# Patient Record
Sex: Female | Born: 1955 | Race: White | Hispanic: No | Marital: Married | Smoking: Never smoker
Health system: Southern US, Community
[De-identification: ages and names within clinical notes are randomized; demographics above are authoritative.]

## PROBLEM LIST (undated history)

## (undated) DIAGNOSIS — C801 Malignant (primary) neoplasm, unspecified: Secondary | ICD-10-CM

## (undated) DIAGNOSIS — R011 Cardiac murmur, unspecified: Secondary | ICD-10-CM

## (undated) DIAGNOSIS — I10 Essential (primary) hypertension: Secondary | ICD-10-CM

## (undated) HISTORY — DX: Essential (primary) hypertension: I10

## (undated) HISTORY — PX: NO PAST SURGERIES: SHX2092

## (undated) HISTORY — PX: COLONOSCOPY: SHX174

## (undated) HISTORY — DX: Malignant (primary) neoplasm, unspecified: C80.1

## (undated) HISTORY — PX: APPENDECTOMY: SHX54

## (undated) HISTORY — DX: Cardiac murmur, unspecified: R01.1

---

## 1997-08-16 ENCOUNTER — Other Ambulatory Visit: Admission: RE | Admit: 1997-08-16 | Discharge: 1997-08-16 | Payer: Self-pay | Admitting: Obstetrics and Gynecology

## 1999-05-24 ENCOUNTER — Other Ambulatory Visit: Admission: RE | Admit: 1999-05-24 | Discharge: 1999-05-24 | Payer: Self-pay | Admitting: Obstetrics and Gynecology

## 2000-05-22 ENCOUNTER — Other Ambulatory Visit: Admission: RE | Admit: 2000-05-22 | Discharge: 2000-05-22 | Payer: Self-pay | Admitting: Obstetrics and Gynecology

## 2000-09-12 ENCOUNTER — Other Ambulatory Visit: Admission: RE | Admit: 2000-09-12 | Discharge: 2000-09-12 | Payer: Self-pay | Admitting: Obstetrics and Gynecology

## 2001-06-04 ENCOUNTER — Encounter: Admission: RE | Admit: 2001-06-04 | Discharge: 2001-06-04 | Payer: Self-pay

## 2001-06-23 ENCOUNTER — Encounter: Admission: RE | Admit: 2001-06-23 | Discharge: 2001-06-23 | Payer: Self-pay

## 2003-03-18 ENCOUNTER — Encounter: Admission: RE | Admit: 2003-03-18 | Discharge: 2003-03-18 | Payer: Self-pay | Admitting: General Surgery

## 2003-10-07 ENCOUNTER — Other Ambulatory Visit: Admission: RE | Admit: 2003-10-07 | Discharge: 2003-10-07 | Payer: Self-pay | Admitting: Gynecology

## 2004-10-25 ENCOUNTER — Other Ambulatory Visit: Admission: RE | Admit: 2004-10-25 | Discharge: 2004-10-25 | Payer: Self-pay | Admitting: Gynecology

## 2005-12-04 ENCOUNTER — Other Ambulatory Visit: Admission: RE | Admit: 2005-12-04 | Discharge: 2005-12-04 | Payer: Self-pay | Admitting: Gynecology

## 2006-12-12 ENCOUNTER — Other Ambulatory Visit: Admission: RE | Admit: 2006-12-12 | Discharge: 2006-12-12 | Payer: Self-pay | Admitting: Gynecology

## 2008-01-15 ENCOUNTER — Other Ambulatory Visit: Admission: RE | Admit: 2008-01-15 | Discharge: 2008-01-15 | Payer: Self-pay | Admitting: Gynecology

## 2013-06-30 ENCOUNTER — Encounter: Payer: Self-pay | Admitting: Pulmonary Disease

## 2013-07-10 ENCOUNTER — Institutional Professional Consult (permissible substitution): Payer: Self-pay | Admitting: Pulmonary Disease

## 2013-08-13 ENCOUNTER — Ambulatory Visit (INDEPENDENT_AMBULATORY_CARE_PROVIDER_SITE_OTHER): Payer: BC Managed Care – PPO | Admitting: Pulmonary Disease

## 2013-08-13 ENCOUNTER — Encounter: Payer: Self-pay | Admitting: Pulmonary Disease

## 2013-08-13 ENCOUNTER — Encounter (INDEPENDENT_AMBULATORY_CARE_PROVIDER_SITE_OTHER): Payer: Self-pay

## 2013-08-13 VITALS — BP 112/76 | HR 50 | Temp 97.8°F | Ht 62.0 in | Wt 145.0 lb

## 2013-08-13 DIAGNOSIS — R0683 Snoring: Secondary | ICD-10-CM

## 2013-08-13 DIAGNOSIS — R0609 Other forms of dyspnea: Secondary | ICD-10-CM

## 2013-08-13 DIAGNOSIS — R0989 Other specified symptoms and signs involving the circulatory and respiratory systems: Secondary | ICD-10-CM

## 2013-08-13 NOTE — Progress Notes (Signed)
Subjective:    Patient ID: Alison Robles, female    DOB: 02-05-1956, 58 y.o.   MRN: [id number]  HPI The patient is a 58 year old female who comes in today as a self-referral for possible sleep apnea. She is have snoring issues for years, but the last 6 months has been noted to have worsening snoring by her husband. He is also commented on an abnormal breathing sounds during sleep. The patient awakens 2-3 times a night on a consistent basis, and is not rested in the mornings upon arising. She feels definite sleep pressure during the day with inactivity, and will use a five-hour energy during in order to stay awake. She gets sleepy in the evenings watching television or movies, and occasional sleepiness with driving. The patient's weight is up about 15 pounds over the last 2 years, and her Epworth score today is 13.   The patient is a 58 year old female who I Sleep Questionnaire What time do you typically go to bed?( Between what hours) 10 pm 10 pm at 1151 on 08/13/13 by Lilli Few, CMA How long does it take you to fall asleep? 10 minutes 10 minutes at 1151 on 08/13/13 by Lilli Few, CMA How many times during the night do you wake up? 2 2 at 1151 on 08/13/13 by Lilli Few, CMA What time do you get out of bed to start your day? [phone] at 1151 on 08/13/13 by Lilli Few, CMA Do you drive or operate heavy machinery in your occupation? No No at 1151 on 08/13/13 by Lilli Few, CMA How much has your weight changed (up or down) over the past two years? (In pounds) 12 lb (5.443 kg) 12 lb (5.443 kg) at 1151 on 08/13/13 by Lilli Few, CMA Have you ever had a sleep study before? No No at 1151 on 08/13/13 by Lilli Few, CMA Do you currently use CPAP? No No at 1151 on 08/13/13 by Lilli Few, CMA Do you wear oxygen at any time? No No at 1151 on 08/13/13 by Lilli Few, CMA   Review of Systems  Constitutional:  Negative for fever and unexpected weight change.  HENT: Negative for congestion, dental problem, ear pain, nosebleeds, postnasal drip, rhinorrhea, sinus pressure, sneezing, sore throat and trouble swallowing.   Eyes: Negative for redness and itching.  Respiratory: Negative for cough, chest tightness, shortness of breath and wheezing.   Cardiovascular: Negative for palpitations and leg swelling.  Gastrointestinal: Negative for nausea and vomiting.  Genitourinary: Negative for dysuria.  Musculoskeletal: Negative for joint swelling.  Skin: Negative for rash.  Neurological: Negative for headaches.  Hematological: Does not bruise/bleed easily.  Psychiatric/Behavioral: Negative for dysphoric mood. The patient is not nervous/anxious.        Objective:   Physical Exam Constitutional:  Overweight female, no acute distress  HENT:  Nares patent without discharge  Oropharynx without exudate, palate and uvula are moderately elongated.  Eyes:  Perrla, eomi, no scleral icterus  Neck:  No JVD, no TMG  Cardiovascular:  Normal rate, regular rhythm, no rubs or gallops.  No murmurs        Intact distal pulses  Pulmonary :  Normal breath sounds, no stridor or respiratory distress   No rales, rhonchi, or wheezing  Abdominal:  Soft, nondistended, bowel sounds present.  No tenderness noted.   Musculoskeletal:  No lower extremity edema noted.  Lymph Nodes:  No cervical lymphadenopathy noted  Skin:  No cyanosis noted  Neurologic:  Alert, appropriate, moves all 4 extremities without obvious deficit.       Assessment & Plan:

## 2013-08-13 NOTE — Patient Instructions (Signed)
I suspect you have symptomatic snoring, but cannot exclude mild sleep apnea.   Work on weight loss, and try to avoid sleeping on your back if possible. If you feel you are not making progress, or if your sleepiness is impacting your quality of life and safety, please call us to arrange a sleep study.

## 2013-08-13 NOTE — Assessment & Plan Note (Signed)
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 I cannot exclude the possibility of mild sleep apnea, but certainly her history is not consistent with more moderate or severe disease. I have outlined a conservative approach with a trial of weight loss and positional therapy, versus proceeding with a sleep study to see if she does have sleep apnea. I do not see this being a cardiac risk factor for her, and therefore her decision should be based on its impact to her quality of life. At this point, the patient would like to take some time to work on weight loss didn't see how she does. If she is not able to lose weight, and continues to have daytime symptoms, she will call to arrange for a sleep study.

## 2019-02-27 ENCOUNTER — Other Ambulatory Visit: Payer: Self-pay

## 2019-02-27 DIAGNOSIS — Z20822 Contact with and (suspected) exposure to covid-19: Secondary | ICD-10-CM

## 2019-02-27 NOTE — Addendum Note (Signed)
Addended by: Alan Mulder on: 02/27/2019 10:27 AM   Modules accepted: Orders

## 2019-03-01 LAB — NOVEL CORONAVIRUS, NAA: SARS-CoV-2, NAA: DETECTED — AB

## 2020-07-12 ENCOUNTER — Encounter: Payer: Self-pay | Admitting: Gastroenterology

## 2020-09-07 ENCOUNTER — Ambulatory Visit (AMBULATORY_SURGERY_CENTER): Payer: BC Managed Care – PPO | Admitting: *Deleted

## 2020-09-07 ENCOUNTER — Other Ambulatory Visit: Payer: Self-pay

## 2020-09-07 VITALS — Ht 61.0 in | Wt 134.0 lb

## 2020-09-07 DIAGNOSIS — Z1211 Encounter for screening for malignant neoplasm of colon: Secondary | ICD-10-CM

## 2020-09-07 MED ORDER — CLENPIQ 10-3.5-12 MG-GM -GM/160ML PO SOLN: 1.0000 | Freq: Once | ORAL | 0 refills | Status: AC

## 2020-09-07 NOTE — Progress Notes (Signed)
Pt's previsit is done over the phone and all paperwork (prep instructions, blank consent form to just read over) sent to patient.  Pt's name and DOB verified at the beginning of the previsit.  Pt denies any difficulty with ambulating.    No trouble with anesthesia, denies trouble moving neck, or hx/fam hx of malignant hyperthermia per pt    No egg or soy allergy  No home oxygen use   No medications for weight loss taken  emmi information given  Pt denies constipation issues  Pt informed that we do not do prior authorizations for prep   Clenpiq coupon sent

## 2020-09-19 ENCOUNTER — Encounter: Payer: Self-pay | Admitting: Gastroenterology

## 2020-10-14 ENCOUNTER — Telehealth: Payer: Self-pay | Admitting: Gastroenterology

## 2020-10-14 DIAGNOSIS — Z1211 Encounter for screening for malignant neoplasm of colon: Secondary | ICD-10-CM

## 2020-10-14 MED ORDER — CLENPIQ 10-3.5-12 MG-GM -GM/160ML PO SOLN: 1.0000 | ORAL | 0 refills | Status: DC

## 2020-10-14 NOTE — Telephone Encounter (Signed)
Inbound call from patient. Have procedure 7/12 but pharmacy states does not have her prep Clenpiq.

## 2020-10-14 NOTE — Telephone Encounter (Signed)
Sent Clenpiq to [facility] in [place]- was sent to [place] and they are closed - pt aware

## 2020-10-18 ENCOUNTER — Encounter: Payer: Self-pay | Admitting: Gastroenterology

## 2020-10-18 ENCOUNTER — Ambulatory Visit (AMBULATORY_SURGERY_CENTER): Payer: BC Managed Care – PPO | Admitting: Gastroenterology

## 2020-10-18 ENCOUNTER — Other Ambulatory Visit: Payer: Self-pay

## 2020-10-18 VITALS — BP 102/60 | HR 51 | Temp 97.4°F | Resp 15 | Ht 61.0 in | Wt 134.0 lb

## 2020-10-18 DIAGNOSIS — Z1211 Encounter for screening for malignant neoplasm of colon: Secondary | ICD-10-CM

## 2020-10-18 MED ORDER — SODIUM CHLORIDE 0.9 % IV SOLN: 500.0000 mL | INTRAVENOUS | Status: DC

## 2020-10-18 NOTE — Patient Instructions (Signed)
Please read handouts provided. Continue present medications. Return to GI clinic as needed. Repeat colonoscopy in 10 years, sooner if clinically indicated.   YOU HAD AN ENDOSCOPIC PROCEDURE TODAY AT [facility]:   Refer to the procedure report that was given to you for any specific questions about what was found during the examination.  If the procedure report does not answer your questions, please call your gastroenterologist to clarify.  If you requested that your care partner not be given the details of your procedure findings, then the procedure report has been included in a sealed envelope for you to review at your convenience later.  YOU SHOULD EXPECT: Some feelings of bloating in the abdomen. Passage of more gas than usual.  Walking can help get rid of the air that was put into your GI tract during the procedure and reduce the bloating. If you had a lower endoscopy (such as a colonoscopy or flexible sigmoidoscopy) you may notice spotting of blood in your stool or on the toilet paper. If you underwent a bowel prep for your procedure, you may not have a normal bowel movement for a few days.  Please Note:  You might notice some irritation and congestion in your nose or some drainage.  This is from the oxygen used during your procedure.  There is no need for concern and it should clear up in a day or so.  SYMPTOMS TO REPORT IMMEDIATELY:  Following lower endoscopy (colonoscopy or flexible sigmoidoscopy):  Excessive amounts of blood in the stool  Significant tenderness or worsening of abdominal pains  Swelling of the abdomen that is new, acute  Fever of 100F or higher  For urgent or emergent issues, a gastroenterologist can be reached at any hour by calling [phone]. Do not use MyChart messaging for urgent concerns.    DIET:  We do recommend a small meal at first, but then you may proceed to your regular diet.  Drink plenty of fluids but you should avoid alcoholic  beverages for 24 hours.  ACTIVITY:  You should plan to take it easy for the rest of today and you should NOT DRIVE or use heavy machinery until tomorrow (because of the sedation medicines used during the test).    FOLLOW UP: Our staff will call the number listed on your records 48-72 hours following your procedure to check on you and address any questions or concerns that you may have regarding the information given to you following your procedure. If we do not reach you, we will leave a message.  We will attempt to reach you two times.  During this call, we will ask if you have developed any symptoms of COVID 19. If you develop any symptoms (ie: fever, flu-like symptoms, shortness of breath, cough etc.) before then, please call [phone].  If you test positive for Covid 19 in the 2 weeks post procedure, please call and report this information to [place].    If any biopsies were taken you will be contacted by phone or by letter within the next 1-3 weeks.  Please call us at [phone] if you have not heard about the biopsies in 3 weeks.    SIGNATURES/CONFIDENTIALITY: You and/or your care partner have signed paperwork which will be entered into your electronic medical record.  These signatures attest to the fact that that the information above on your After Visit Summary has been reviewed and is understood.  Full responsibility of the confidentiality of this discharge information lies with  you and/or your care-partner.

## 2020-10-18 NOTE — Op Note (Signed)
[facility] Patient Name: Alison Robles Procedure Date: 10/18/2020 2:16 PM MRN: [id number] Endoscopist: Jackquline Denmark , MD Age: 65 Referring MD:  Date of Birth: Mar 27, 1956 Gender: Female Account #: [id number] Procedure:                Colonoscopy Indications:              Screening for colorectal malignant neoplasm Medicines:                Monitored Anesthesia Care Procedure:                Pre-Anesthesia Assessment:                           - Prior to the procedure, a History and Physical                            was performed, and patient medications and                            allergies were reviewed. The patient's tolerance of                            previous anesthesia was also reviewed. The risks                            and benefits of the procedure and the sedation                            options and risks were discussed with the patient.                            All questions were answered, and informed consent                            was obtained. Prior Anticoagulants: The patient has                            taken no previous anticoagulant or antiplatelet                            agents. ASA Grade Assessment: II - A patient with                            mild systemic disease. After reviewing the risks                            and benefits, the patient was deemed in                            satisfactory condition to undergo the procedure.                           After obtaining informed consent, the colonoscope  was passed under direct vision. Throughout the                            procedure, the patient's blood pressure, pulse, and                            oxygen saturations were monitored continuously. The                            Olympus [id number]-[id number] (#[id number]) Colonoscope was                            introduced through the anus and advanced to the the                            cecum, identified by  appendiceal orifice and                            ileocecal valve. The colonoscopy was somewhat                            difficult due to a tortuous colon. Successful                            completion of the procedure was aided by applying                            abdominal pressure. The patient tolerated the                            procedure well. The quality of the bowel                            preparation was good. The ileocecal valve,                            appendiceal orifice, and rectum were photographed. Scope In: 2:21:24 PM Scope Out: 2:44:42 PM Scope Withdrawal Time: 0 hours 6 minutes 6 seconds  Total Procedure Duration: 0 hours 23 minutes 18 seconds  Findings:                 The colon (entire examined portion) appeared normal.                           Non-bleeding internal hemorrhoids were found during                            retroflexion. The hemorrhoids were small.                           The exam was otherwise without abnormality on                            direct and retroflexion views. The colon was highly  redundant. Complications:            No immediate complications. Estimated Blood Loss:     Estimated blood loss: none. Impression:               - The entire examined colon is normal. Highly                            redundant colon.                           - Non-bleeding internal hemorrhoids.                           - The examination was otherwise normal on direct                            and retroflexion views.                           - No specimens collected. Recommendation:           - Patient has a contact number available for                            emergencies. The signs and symptoms of potential                            delayed complications were discussed with the                            patient. Return to normal activities tomorrow.                            Written discharge instructions  were provided to the                            patient.                           - Resume previous diet.                           - Continue present medications.                           - Repeat colonoscopy in 10 years for screening                            purposes. Earlier, if clinically indicated.                           - Return to GI clinic PRN. Jackquline Denmark, MD 10/18/2020 2:49:05 PM This report has been signed electronically.

## 2020-10-18 NOTE — Progress Notes (Signed)
Report to PACU, RN, vss, BBS= Clear.  

## 2020-10-20 ENCOUNTER — Telehealth: Payer: Self-pay | Admitting: *Deleted

## 2020-10-20 NOTE — Telephone Encounter (Signed)
  Follow up Call-  Call back number 10/18/2020  Post procedure Call Back phone  # [phone]  Permission to leave phone message Yes  Some recent data might be hidden     Patient questions:  Do you have a fever, pain , or abdominal swelling? No. Pain Score  0 *  Have you tolerated food without any problems? Yes.    Have you been able to return to your normal activities? Yes.    Do you have any questions about your discharge instructions: Diet   No. Medications  No. Follow up visit  No.  Do you have questions or concerns about your Care? No.  Actions: * If pain score is 4 or above: No action needed, pain <4.  Have you developed a fever since your procedure? no  2.   Have you had an respiratory symptoms (SOB or cough) since your procedure? no  3.   Have you tested positive for COVID 19 since your procedure no  4.   Have you had any family members/close contacts diagnosed with the COVID 19 since your procedure?  no   If yes to any of these questions please route to Joylene John, RN and Joella Prince, RN

## 2021-05-17 ENCOUNTER — Other Ambulatory Visit: Payer: Self-pay | Admitting: Obstetrics and Gynecology

## 2021-05-17 DIAGNOSIS — Z8249 Family history of ischemic heart disease and other diseases of the circulatory system: Secondary | ICD-10-CM

## 2021-06-19 ENCOUNTER — Ambulatory Visit
Admission: RE | Admit: 2021-06-19 | Discharge: 2021-06-19 | Disposition: A | Discharge status: Home / Self Care | Payer: No Typology Code available for payment source | Source: Ambulatory Visit | Attending: Obstetrics and Gynecology | Admitting: Obstetrics and Gynecology

## 2021-06-19 DIAGNOSIS — Z8249 Family history of ischemic heart disease and other diseases of the circulatory system: Secondary | ICD-10-CM

## 2022-06-20 NOTE — Progress Notes (Signed)
Cardiology Office Note:    Date:  07/02/2022   ID:  Alison, Robles [birth year], MRN [id number]  PCP:  Philmore Pali, NP (Inactive)   [facility] Providers Cardiologist:  None   Referring MD: Arvella Nigh, MD    History of Present Illness:    Alison Robles is a 67 y.o. female with a hx of HTN and coronary artery Ca who was referred by Dr. Radene Knee for family history of CAD.  Patient seen by Dr. Radene Knee on 06/06/22. Notes reviewed. Ca score 06/2021 2.6 (56%). Also with family history of CAD prompting referral to Cardiology.  Today, the patient overall feels well. No chest pain, SOB, orthopnea, PND, palpitations, LE edema, lightheadedness, or syncope. Family history notable for 4 brothers with MI (deceased in 7s), one other brother with CABG (12), father with MI in 72s. Mother with HTN.   Blood pressure 120/80s.  Past Medical History:  Diagnosis Date   Cancer ([place])    melanoma- stomach   Heart murmur    Hypertension     Past Surgical History:  Procedure Laterality Date   APPENDECTOMY     COLONOSCOPY      Current Medications: Current Meds  Medication Sig   Flaxseed, Linseed, (FLAXSEED OIL) 1000 MG CAPS Take 1,400 mg by mouth daily.   Glucosamine-Chondroit-Vit C-Mn (GLUCOSAMINE 1500 COMPLEX PO) Take 2 capsules by mouth daily.   lisinopril-hydrochlorothiazide (ZESTORETIC) 10-12.5 MG tablet Take 1 tablet by mouth daily.   Multiple Vitamins-Minerals (MULTIVITAMIN WITH MINERALS) tablet Take 1 tablet by mouth daily.   Omega-3 Fatty Acids (FISH OIL) 1200 MG CAPS Take 1 capsule by mouth daily.   rosuvastatin (CRESTOR) 10 MG tablet Take 1 tablet (10 mg total) by mouth daily.     Allergies:   Patient has no known allergies.   Social History   Socioeconomic History   Marital status: Married    Spouse name: Not on file   Number of children: Not on file   Years of education: Not on file   Highest education level: Not on file  Occupational History   Occupation:  Secondary school teacher  Tobacco Use   Smoking status: Never   Smokeless tobacco: Never  Vaping Use   Vaping Use: Never used  Substance and Sexual Activity   Alcohol use: No   Drug use: No   Sexual activity: Not on file  Other Topics Concern   Not on file  Social History Narrative   Not on file   Social Determinants of Health   Financial Resource Strain: Not on file  Food Insecurity: Not on file  Transportation Needs: Not on file  Physical Activity: Not on file  Stress: Not on file  Social Connections: Not on file     Family History: The patient's family history includes Breast cancer in her maternal aunt; Colon cancer in an other family member; Heart disease in her father. There is no history of Esophageal cancer, Rectal cancer, or Stomach cancer.  ROS:   Please see the history of present illness.     All other systems reviewed and are negative.  EKGs/Labs/Other Studies Reviewed:    The following studies were reviewed today: Ca score 06/2021: FINDINGS: CORONARY CALCIUM SCORES:   Left Main: 0   LAD: 2.6   LCx: 0   RCA: 0   Total Agatston Score: 2.6   MESA database percentile: 56   AORTA MEASUREMENTS:   Ascending Aorta: 30 mm   Descending Aorta: 22 mm  OTHER FINDINGS:   The heart size is within normal limits. No pericardial fluid is identified. Visualized segments of the thoracic aorta and central pulmonary arteries are normal in caliber. Visualized mediastinum and hilar regions demonstrate no lymphadenopathy or masses. 4 mm fissural nodularity within the inferior and medial aspect of the right major fissure likely represents an intrapulmonary lymph node. Visualized lungs show no evidence of pulmonary edema, consolidation, pneumothorax or pleural fluid. Visualized upper abdomen and bony structures are unremarkable.   IMPRESSION: 1. Coronary calcium score of 2.6 is at the 56th percentile at the patient's age, sex and race. 2. 4 mm fissural nodule in the  right major fissure. 4 mm right pulmonary nodule, possibly an intrapulmonary lymph node. No routine follow-up imaging is recommended per Fleischner Society Guidelines. These guidelines do not apply to immunocompromised patients and patients with cancer. Follow up in patients with significant comorbidities as clinically warranted. For lung cancer screening, adhere to Lung-RADS guidelines. Reference: Radiology. 2017; 284(1):228-43; Radiology. 2012; 265(2):611-6; Radiology. 2010; 254(3):949-56.    EKG:  EKG is  ordered today.  The ekg ordered today demonstrates SB, HR 54  Recent Labs: No results found for requested labs within last 365 days.  Recent Lipid Panel No results found for: "CHOL", "TRIG", "HDL", "CHOLHDL", "VLDL", "LDLCALC", "LDLDIRECT"   Risk Assessment/Calculations:          Physical Exam:    VS:  BP (!) 126/90   Pulse (!) 54   Ht 5\' 1"  (1.549 m)   Wt 140 lb 9.6 oz (63.8 kg)   SpO2 96%   BMI 26.57 kg/m     Wt Readings from Last 3 Encounters:  07/02/22 140 lb 9.6 oz (63.8 kg)  10/18/20 134 lb (60.8 kg)  09/07/20 134 lb (60.8 kg)     GEN:  Well nourished, well developed in no acute distress HEENT: Normal NECK: No JVD; No carotid bruits CARDIAC: Bradycardic, regular, 1/6 systolic murmur RESPIRATORY:  Clear to auscultation without rales, wheezing or rhonchi  ABDOMEN: Soft, non-tender, non-distended MUSCULOSKELETAL:  No edema; No deformity  SKIN: Warm and dry NEUROLOGIC:  Alert and oriented x 3 PSYCHIATRIC:  Normal affect   ASSESSMENT:    1. Coronary artery calcification   2. Medication management   3. Hyperlipidemia, unspecified hyperlipidemia type   4. Family history of early CAD   16. Primary hypertension    PLAN:    In order of problems listed above:  #Coronary Artery Ca: #Family History of CAD: Patient with Ca score 2.6 (56%) as well as family history of CAD. She is reassuringly asymptomatic without anginal symptoms. Will continue with  secondary prevention as below. -Start crestor 10mg  daily -Check NMR, Lp(a), apolipoprotein B in 8 weeks  #HTN: -Continue lisinopril-HCTZ 10-12.5mg  daily -Well controlled at home running 120/80s  #HLD: LDL 134, HDL 50, TG 58.  -Start crestor 10mg  daily -Check NMR, Lp(a), apolipoprotein B in 8 weeks           Medication Adjustments/Labs and Tests Ordered: Current medicines are reviewed at length with the patient today.  Concerns regarding medicines are outlined above.  Orders Placed This Encounter  Procedures   NMR, lipoprofile   Lipoprotein A (LPA)   Apolipoprotein B   EKG 12-Lead   Meds ordered this encounter  Medications   rosuvastatin (CRESTOR) 10 MG tablet    Sig: Take 1 tablet (10 mg total) by mouth daily.    Dispense:  90 tablet    Refill:  2    There are  no Patient Instructions on file for this visit.   Signed, Freada Bergeron, MD  07/02/2022 10:13 AM    [place]

## 2022-07-02 ENCOUNTER — Encounter: Payer: Self-pay | Admitting: Cardiology

## 2022-07-02 ENCOUNTER — Ambulatory Visit: Payer: Medicare PPO | Attending: Cardiology | Admitting: Cardiology

## 2022-07-02 VITALS — BP 126/90 | HR 54 | Ht 61.0 in | Wt 140.6 lb

## 2022-07-02 DIAGNOSIS — Z79899 Other long term (current) drug therapy: Secondary | ICD-10-CM

## 2022-07-02 DIAGNOSIS — Z8249 Family history of ischemic heart disease and other diseases of the circulatory system: Secondary | ICD-10-CM | POA: Diagnosis not present

## 2022-07-02 DIAGNOSIS — I10 Essential (primary) hypertension: Secondary | ICD-10-CM

## 2022-07-02 DIAGNOSIS — E785 Hyperlipidemia, unspecified: Secondary | ICD-10-CM

## 2022-07-02 DIAGNOSIS — I2510 Atherosclerotic heart disease of native coronary artery without angina pectoris: Secondary | ICD-10-CM | POA: Diagnosis not present

## 2022-07-02 DIAGNOSIS — I2584 Coronary atherosclerosis due to calcified coronary lesion: Secondary | ICD-10-CM

## 2022-07-02 MED ORDER — ROSUVASTATIN CALCIUM 10 MG PO TABS: 10.0000 mg | ORAL_TABLET | Freq: Every day | ORAL | 2 refills | Status: DC

## 2022-07-02 NOTE — Patient Instructions (Signed)
Medication Instructions:   START TAKING ROSUVASTATIN (CRESTOR) 10 MG BY MOUTH DAILY  *If you need a refill on your cardiac medications before your next appointment, please call your pharmacy*   Lab Work:  IN [place] OFFICE--CHECK NMR LIPOPROFILE, LIPOPROTEIN A, AND APOLIPOPROTEIN B--PLEASE COME FASTING TO THIS LAB APPOINTMENT  If you have labs (blood work) drawn today and your tests are completely normal, you will receive your results only by: [place] (if you have MyChart) OR A paper copy in the mail If you have any lab test that is abnormal or we need to change your treatment, we will call you to review the results.    Follow-Up: At [facility], you and your health needs are our priority.  As part of our continuing mission to provide you with exceptional heart care, we have created designated Provider Care Teams.  These Care Teams include your primary Cardiologist (physician) and Advanced Practice Providers (APPs -  Physician Assistants and Nurse Practitioners) who all work together to provide you with the care you need, when you need it.  We recommend signing up for the patient portal called "MyChart".  Sign up information is provided on this After Visit Summary.  MyChart is used to connect with patients for Virtual Visits (Telemedicine).  Patients are able to view lab/test results, encounter notes, upcoming appointments, etc.  Non-urgent messages can be sent to your provider as well.   To learn more about what you can do with MyChart, go to [email or web address].    Your next appointment:   1 year(s)  Provider:   DR. Johney Frame

## 2022-07-13 ENCOUNTER — Telehealth: Payer: Self-pay | Admitting: Cardiology

## 2022-07-13 NOTE — Telephone Encounter (Signed)
Pt calling in for lab and EKG results

## 2022-07-13 NOTE — Telephone Encounter (Signed)
Patient called asking if the report for her EKG is finalized. Advised patient the EKG report is still pending. Will forward to MD and nurse. Patient voiced understanding.

## 2022-07-13 NOTE — Telephone Encounter (Signed)
Pt was calling in to have someone explain to her, her EKG tracing that was scanned into her mychart.  Went over the tracing with her.  Pt education provided.  Pt verbalized understanding and was gracious for all the assistance provided.

## 2022-08-09 DIAGNOSIS — R931 Abnormal findings on diagnostic imaging of heart and coronary circulation: Secondary | ICD-10-CM | POA: Diagnosis not present

## 2022-08-09 DIAGNOSIS — I10 Essential (primary) hypertension: Secondary | ICD-10-CM | POA: Diagnosis not present

## 2022-08-09 DIAGNOSIS — M8580 Other specified disorders of bone density and structure, unspecified site: Secondary | ICD-10-CM | POA: Diagnosis not present

## 2022-08-09 DIAGNOSIS — H8110 Benign paroxysmal vertigo, unspecified ear: Secondary | ICD-10-CM | POA: Diagnosis not present

## 2022-08-09 DIAGNOSIS — Z6827 Body mass index (BMI) 27.0-27.9, adult: Secondary | ICD-10-CM | POA: Diagnosis not present

## 2022-08-09 DIAGNOSIS — Z1331 Encounter for screening for depression: Secondary | ICD-10-CM | POA: Diagnosis not present

## 2022-08-09 DIAGNOSIS — Z9181 History of falling: Secondary | ICD-10-CM | POA: Diagnosis not present

## 2022-08-28 ENCOUNTER — Ambulatory Visit: Payer: Medicare PPO | Attending: Cardiology

## 2022-08-28 DIAGNOSIS — E785 Hyperlipidemia, unspecified: Secondary | ICD-10-CM

## 2022-08-28 DIAGNOSIS — Z8249 Family history of ischemic heart disease and other diseases of the circulatory system: Secondary | ICD-10-CM

## 2022-08-28 DIAGNOSIS — Z79899 Other long term (current) drug therapy: Secondary | ICD-10-CM | POA: Diagnosis not present

## 2022-08-29 LAB — NMR, LIPOPROFILE
Cholesterol, Total: 136 mg/dL (ref 100–199)
HDL Particle Number: 38.5 umol/L (ref >=30.5)
HDL-C: 56 mg/dL (ref >39)
LDL Particle Number: 804 nmol/L (ref <1000)
LDL Size: 20.6 nm (ref >20.5)
LDL-C (NIH Calc): 66 mg/dL (ref 0–99)
LP-IR Score: 36 (ref <=45)
Small LDL Particle Number: 407 nmol/L (ref <=527)
Triglycerides: 71 mg/dL (ref 0–149)

## 2022-08-29 LAB — APOLIPOPROTEIN B: Apolipoprotein B: 59 mg/dL (ref <90)

## 2022-08-29 LAB — LIPOPROTEIN A (LPA): Lipoprotein (a): <8.4 nmol/L (ref <75.0)

## 2022-08-31 ENCOUNTER — Ambulatory Visit: Payer: Medicare PPO

## 2022-10-23 DIAGNOSIS — H52223 Regular astigmatism, bilateral: Secondary | ICD-10-CM | POA: Diagnosis not present

## 2022-10-23 DIAGNOSIS — H25813 Combined forms of age-related cataract, bilateral: Secondary | ICD-10-CM | POA: Diagnosis not present

## 2022-10-23 DIAGNOSIS — H524 Presbyopia: Secondary | ICD-10-CM | POA: Diagnosis not present

## 2022-10-23 DIAGNOSIS — H5203 Hypermetropia, bilateral: Secondary | ICD-10-CM | POA: Diagnosis not present

## 2023-01-24 DIAGNOSIS — Z85820 Personal history of malignant melanoma of skin: Secondary | ICD-10-CM | POA: Diagnosis not present

## 2023-01-24 DIAGNOSIS — D225 Melanocytic nevi of trunk: Secondary | ICD-10-CM | POA: Diagnosis not present

## 2023-01-24 DIAGNOSIS — L570 Actinic keratosis: Secondary | ICD-10-CM | POA: Diagnosis not present

## 2023-01-24 DIAGNOSIS — L578 Other skin changes due to chronic exposure to nonionizing radiation: Secondary | ICD-10-CM | POA: Diagnosis not present

## 2023-01-24 DIAGNOSIS — L814 Other melanin hyperpigmentation: Secondary | ICD-10-CM | POA: Diagnosis not present

## 2023-02-15 DIAGNOSIS — H8110 Benign paroxysmal vertigo, unspecified ear: Secondary | ICD-10-CM | POA: Diagnosis not present

## 2023-02-15 DIAGNOSIS — I10 Essential (primary) hypertension: Secondary | ICD-10-CM | POA: Diagnosis not present

## 2023-02-15 DIAGNOSIS — Z79899 Other long term (current) drug therapy: Secondary | ICD-10-CM | POA: Diagnosis not present

## 2023-02-15 DIAGNOSIS — R931 Abnormal findings on diagnostic imaging of heart and coronary circulation: Secondary | ICD-10-CM | POA: Diagnosis not present

## 2023-02-15 DIAGNOSIS — M8580 Other specified disorders of bone density and structure, unspecified site: Secondary | ICD-10-CM | POA: Diagnosis not present

## 2023-02-16 LAB — LAB REPORT - SCANNED: EGFR: 91

## 2023-04-08 ENCOUNTER — Other Ambulatory Visit: Payer: Self-pay

## 2023-04-08 DIAGNOSIS — Z8249 Family history of ischemic heart disease and other diseases of the circulatory system: Secondary | ICD-10-CM

## 2023-04-08 DIAGNOSIS — Z79899 Other long term (current) drug therapy: Secondary | ICD-10-CM

## 2023-04-08 DIAGNOSIS — E785 Hyperlipidemia, unspecified: Secondary | ICD-10-CM

## 2023-04-08 MED ORDER — ROSUVASTATIN CALCIUM 10 MG PO TABS: 10.0000 mg | ORAL_TABLET | Freq: Every day | ORAL | 0 refills | Status: DC

## 2023-06-06 DIAGNOSIS — Z1231 Encounter for screening mammogram for malignant neoplasm of breast: Secondary | ICD-10-CM | POA: Diagnosis not present

## 2023-06-06 DIAGNOSIS — Z124 Encounter for screening for malignant neoplasm of cervix: Secondary | ICD-10-CM | POA: Diagnosis not present

## 2023-06-06 DIAGNOSIS — Z6825 Body mass index (BMI) 25.0-25.9, adult: Secondary | ICD-10-CM | POA: Diagnosis not present

## 2023-06-14 DIAGNOSIS — C44519 Basal cell carcinoma of skin of other part of trunk: Secondary | ICD-10-CM | POA: Diagnosis not present

## 2023-06-20 DIAGNOSIS — R1031 Right lower quadrant pain: Secondary | ICD-10-CM | POA: Diagnosis not present

## 2023-06-20 DIAGNOSIS — Z8041 Family history of malignant neoplasm of ovary: Secondary | ICD-10-CM | POA: Diagnosis not present

## 2023-06-25 DIAGNOSIS — M5431 Sciatica, right side: Secondary | ICD-10-CM | POA: Diagnosis not present

## 2023-06-25 DIAGNOSIS — M1611 Unilateral primary osteoarthritis, right hip: Secondary | ICD-10-CM | POA: Diagnosis not present

## 2023-07-01 ENCOUNTER — Ambulatory Visit: Payer: Medicare PPO | Attending: Cardiology | Admitting: Cardiology

## 2023-07-01 ENCOUNTER — Encounter: Payer: Self-pay | Admitting: Cardiology

## 2023-07-01 VITALS — BP 138/80 | HR 63 | Ht 61.0 in | Wt 139.0 lb

## 2023-07-01 DIAGNOSIS — I2510 Atherosclerotic heart disease of native coronary artery without angina pectoris: Secondary | ICD-10-CM | POA: Insufficient documentation

## 2023-07-01 DIAGNOSIS — I10 Essential (primary) hypertension: Secondary | ICD-10-CM | POA: Diagnosis not present

## 2023-07-01 DIAGNOSIS — C44519 Basal cell carcinoma of skin of other part of trunk: Secondary | ICD-10-CM | POA: Diagnosis not present

## 2023-07-01 DIAGNOSIS — E782 Mixed hyperlipidemia: Secondary | ICD-10-CM | POA: Diagnosis not present

## 2023-07-01 NOTE — Patient Instructions (Addendum)
  Follow-Up: At [facility], you and your health needs are our priority.  As part of our continuing mission to provide you with exceptional heart care, we have created designated Provider Care Teams.  These Care Teams include your primary Cardiologist (physician) and Advanced Practice Providers (APPs -  Physician Assistants and Nurse Practitioners) who all work together to provide you with the care you need, when you need it.    Your next appointment:   AS NEEDED   Provider:   Elder Negus, MD     Other Instructions   1st Floor: - Lobby - Registration  - Pharmacy  - Lab - Cafe  2nd Floor: - PV Lab - Diagnostic Testing (echo, CT, nuclear med)  3rd Floor: - Vacant  4th Floor: - TCTS (cardiothoracic surgery) - AFib Clinic - Structural Heart Clinic - Vascular Surgery  - Vascular Ultrasound  5th Floor: - [facility] (general and EP) - Clinical Pharmacy for coumadin, hypertension, lipid, weight-loss medications, and med management appointments    Valet parking services will be available as well.

## 2023-07-01 NOTE — Progress Notes (Signed)
 Cardiology Office Note:  .   Date:  07/01/2023  ID:  Alison Robles, DOB [birth year], MRN [id number] PCP: Ronal Fear, NP (Inactive)  [facility] Providers Cardiologist:  Truett Mainland, MD PCP: Ronal Fear, NP (Inactive)  Chief Complaint  Patient presents with   Coronary cartery calcification      History of Present Illness: .    Alison Robles is a 68 y.o. female with hypertension, hyperlipidemia, coronary calcification  Discussed the use of AI scribe software for clinical note transcription with the patient, who gave verbal consent to proceed.  History of Present Illness The patient, with a history of mild coronary artery calcification and hypertension, presents for a follow-up visit. She has been on a regimen of Crestor for the coronary artery calcification and lisinopril and hydrochlorothiazide for the hypertension. She reports tolerating the medications well with no adverse effects. Occasionally, she notes that her blood pressure is slightly elevated, with the diastolic number reaching into the nineties. However, she does not report any associated symptoms.  The patient admits to not being as physically active as she should be, engaging in occasional tennis but not much else. She reports feeling winded at times, but attributes this to being out of shape rather than any cardiac symptoms. She denies any chest pain or shortness of breath.  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 She expresses concern about potential cardiac effects of the prednisone.     There were no vitals filed for this visit.   ROS:  Review of Systems  Cardiovascular:  Negative for chest pain, dyspnea on exertion, leg swelling, palpitations and syncope.        Studies Reviewed: Marland Kitchen        EKG 07/01/2023: Normal sinus rhythm Low voltage QRS Cannot rule out Septal  infarct , age undetermined No previous ECGs available    Independently interpreted 02/2023: Chol 148, TG 56, HDL 55, LDL 81 Cr 0.73   08/2022: Chol 136, TG 71, HDL 56, LDL 66 Lipoprotein a <8   CT cardiac scoring 06/2021: 1. Coronary calcium score of 2.6 (LAD)  is at the 56th percentile at the patient's age, sex and race. 2. 4 mm fissural nodule in the right major fissure.     Physical Exam:   Physical Exam Vitals and nursing note reviewed.  Constitutional:      General: She is not in acute distress. Neck:     Vascular: No JVD.  Cardiovascular:     Rate and Rhythm: Normal rate and regular rhythm.     Heart sounds: Normal heart sounds. No murmur heard. Pulmonary:     Effort: Pulmonary effort is normal.     Breath sounds: Normal breath sounds. No wheezing or rales.  Musculoskeletal:     Right lower leg: No edema.     Left lower leg: No edema.      VISIT DIAGNOSES:   ICD-10-CM   1. Mixed hyperlipidemia  E78.2 EKG 12-Lead    2. Primary hypertension  I10 EKG 12-Lead    3. Coronary artery calcification  I25.10 EKG 12-Lead       ASSESSMENT AND PLAN: .    Alison Robles is a 68 y.o. female with hypertension, hyperlipidemia, coronary calcification  Hypertension: Mild elevated blood pressure.  I do think that increasing physical activity, this control.  If it does not, consider adding amlodipine 2.5  mg daily.  Hyperlipidemia: Plan: Calcification.  LDL currently 81.  Again, discussed heart healthy diet and lifestyle.  She wants to hold off increasing Crestor dose until her PCP visit in July.  If fatigue remains >70, recommend increasing Crestor to 20 mg daily.  She will continue follow-up with her PCP.  I will see her as needed.    Signed, Elder Negus, MD

## 2023-07-15 ENCOUNTER — Other Ambulatory Visit: Payer: Self-pay | Admitting: Physician Assistant

## 2023-07-15 DIAGNOSIS — Z8249 Family history of ischemic heart disease and other diseases of the circulatory system: Secondary | ICD-10-CM

## 2023-07-15 DIAGNOSIS — E785 Hyperlipidemia, unspecified: Secondary | ICD-10-CM

## 2023-07-15 DIAGNOSIS — Z79899 Other long term (current) drug therapy: Secondary | ICD-10-CM

## 2023-08-26 DIAGNOSIS — J40 Bronchitis, not specified as acute or chronic: Secondary | ICD-10-CM | POA: Diagnosis not present

## 2023-08-26 DIAGNOSIS — J123 Human metapneumovirus pneumonia: Secondary | ICD-10-CM | POA: Diagnosis not present

## 2023-08-26 DIAGNOSIS — R6889 Other general symptoms and signs: Secondary | ICD-10-CM | POA: Diagnosis not present

## 2023-08-27 DIAGNOSIS — J208 Acute bronchitis due to other specified organisms: Secondary | ICD-10-CM | POA: Diagnosis not present

## 2023-08-27 DIAGNOSIS — R059 Cough, unspecified: Secondary | ICD-10-CM | POA: Diagnosis not present

## 2023-10-20 IMAGING — CT CT CARDIAC CORONARY ARTERY CALCIUM SCORE
3 series · 12 of 20 positions shown, 14 images · non-contrast
Comparison: None.

CLINICAL DATA: 66-year-old Caucasian female with history of
hyperlipidemia and family history of heart disease.

EXAM:
CT CARDIAC CORONARY ARTERY CALCIUM SCORE
TECHNIQUE: Non-contrast imaging through the heart was performed using
prospective ECG gating. Image post processing was performed on an
independent workstation, allowing for quantitative analysis of the
heart and coronary arteries. Note that this exam targets the heart
and the chest was not imaged in its entirety.

[Series 2: calcium scoring 2.00 qr36 bestdiast 69% hrt calciu · axial · 0.36mm/px · z∈[+1509,+1541]mm · 2 of 80 slices shown]
[im 16/80  vessel]
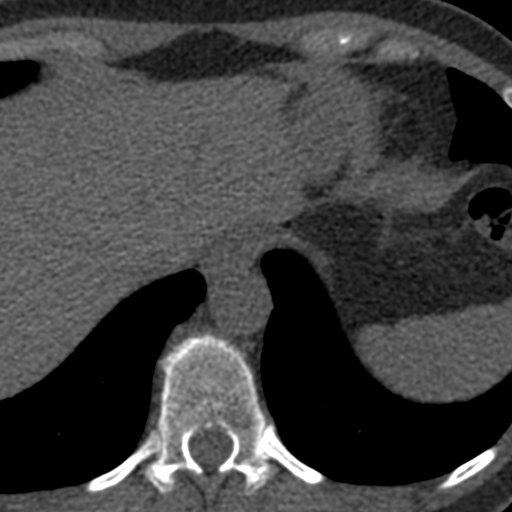
[im 32/80  vessel]
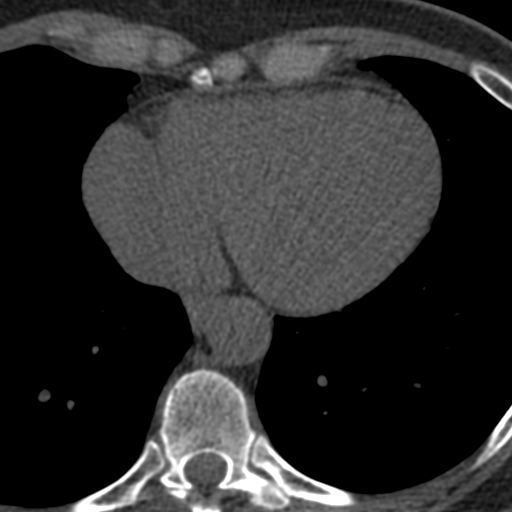

[Series 3: calcium scoring 2.00 br40 bestdiast 69% axial · axial · 0.52mm/px · z∈[+1505,+1609]mm · 5 of 80 slices shown, 7 images]
[im 14/80  vessel]
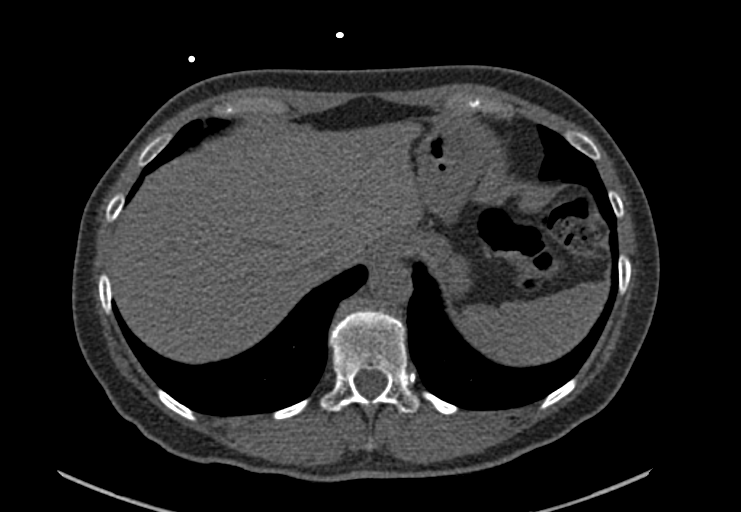
[im 14/80  lung]
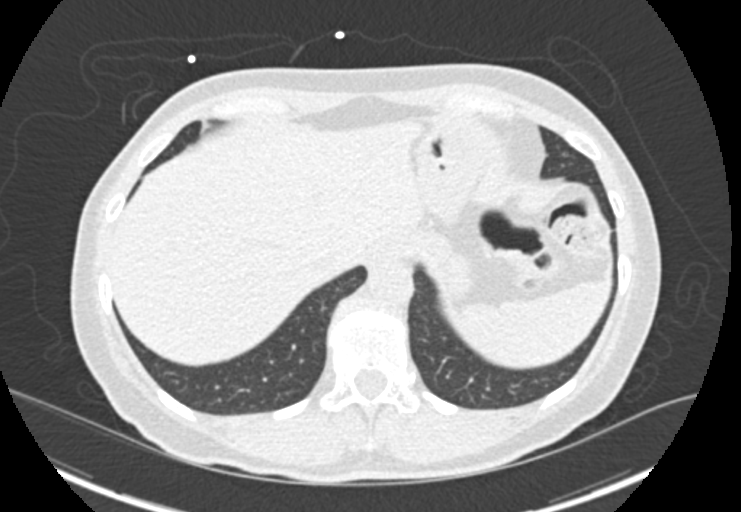
[im 27/80  vessel]
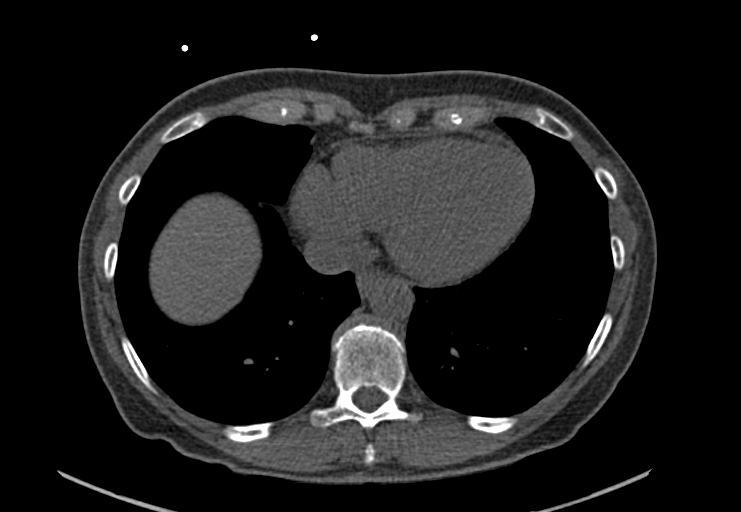
[im 40/80  vessel]
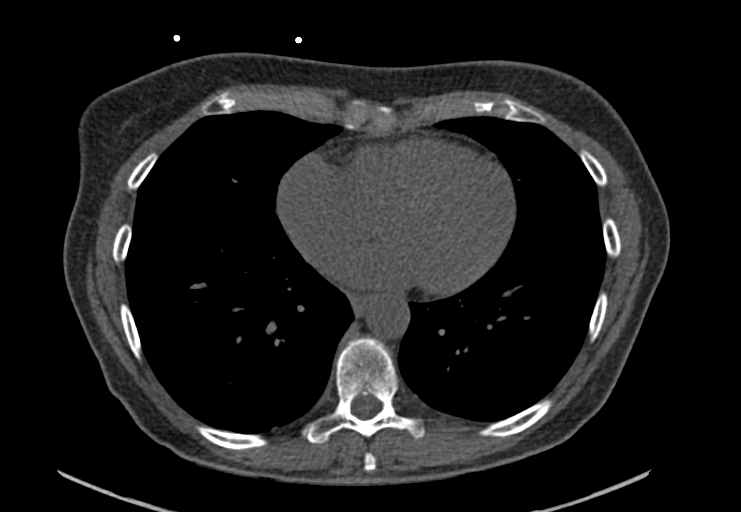
[im 53/80  vessel]
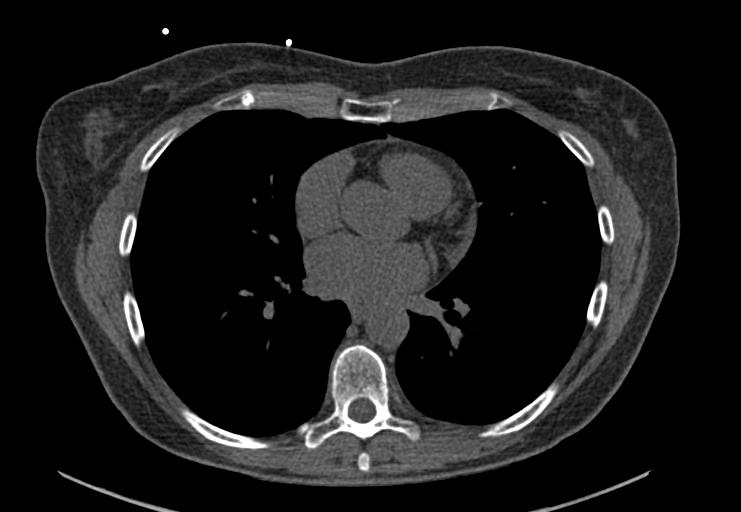
[im 66/80  vessel]
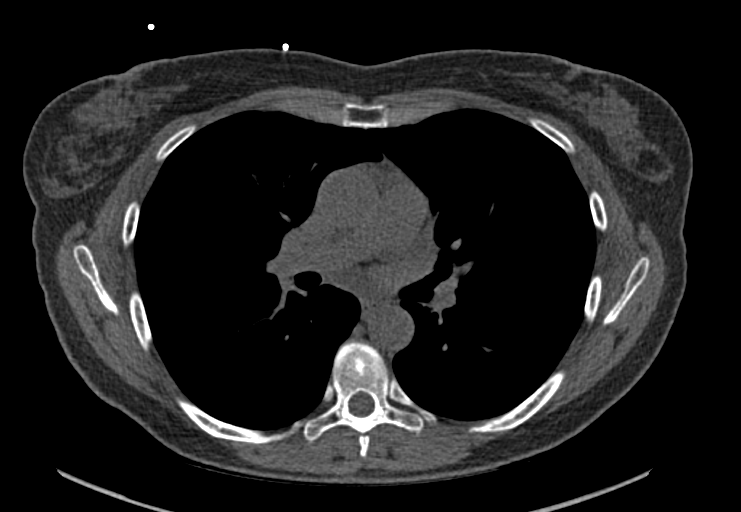
[im 66/80  lung]
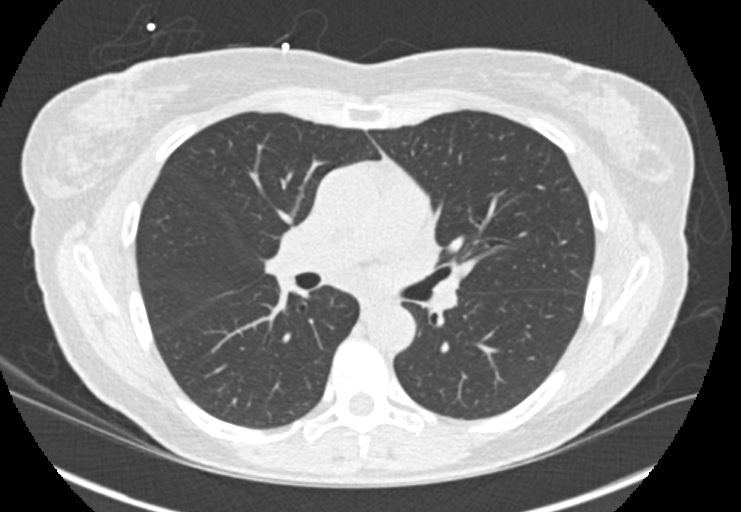

[Series 9: calcium scoring 2.00 br60 bestdiast 69% lungs · axial · 0.52mm/px · z∈[+1505,+1609]mm · 5 of 80 slices shown]
[im 14/80  vessel]
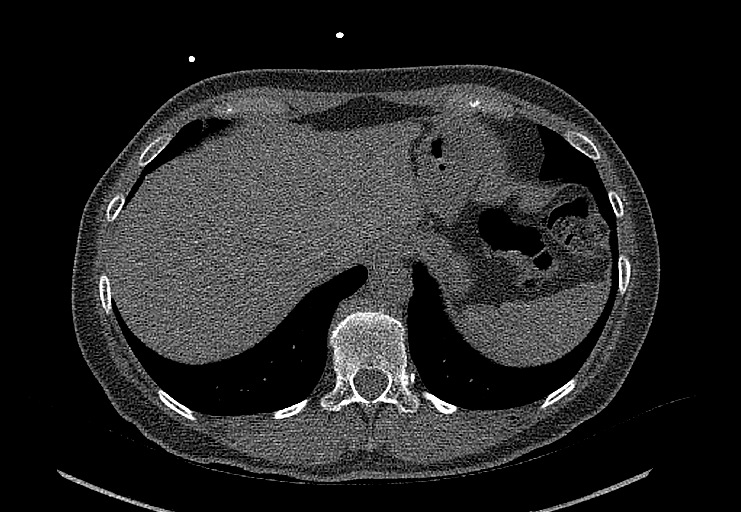
[im 27/80  vessel]
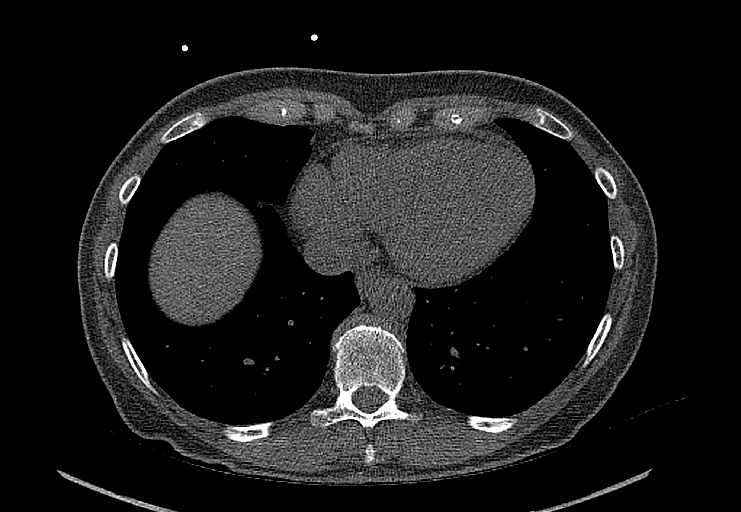
[im 40/80  vessel]
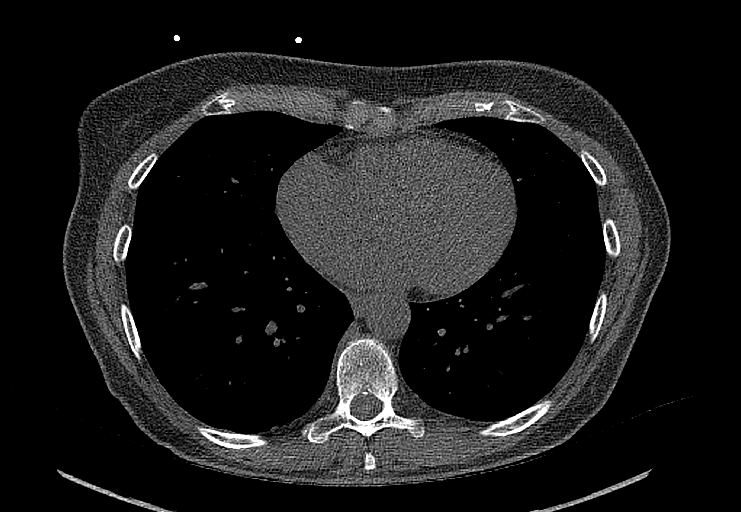
[im 53/80  vessel]
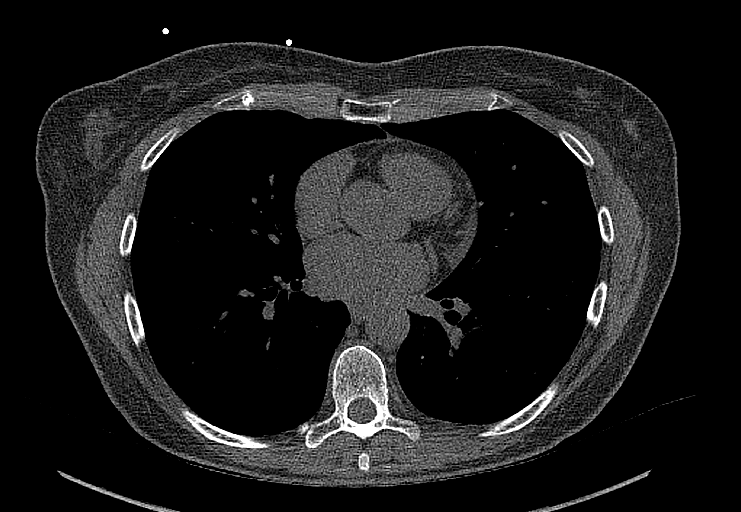
[im 66/80  vessel]
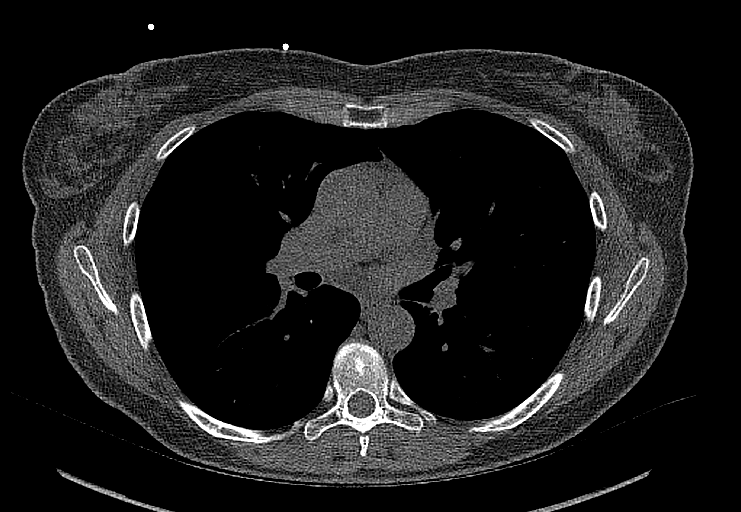

[12 of 20 positions shown; findings below may reference images not displayed]

FINDINGS: CORONARY CALCIUM SCORES:

Left Main: 0

LAD:

LCx: 0

RCA: 0

Total Agatston Score:

[HOSPITAL] percentile: 56

AORTA MEASUREMENTS:

Ascending Aorta: 30 mm

Descending Aorta: 22 mm

OTHER FINDINGS:

The heart size is within normal limits. No pericardial fluid is
identified. Visualized segments of the thoracic aorta and central
pulmonary arteries are normal in caliber. Visualized mediastinum and
hilar regions demonstrate no lymphadenopathy or masses. 4 mm
fissural nodularity within the inferior and medial aspect of the
right major fissure likely represents an intrapulmonary lymph node.
Visualized lungs show no evidence of pulmonary edema, consolidation,
pneumothorax or pleural fluid. Visualized upper abdomen and bony
structures are unremarkable.
IMPRESSION: 1. Coronary calcium score of 2.6 is at the 56th percentile at the
patient's age, sex and race.
2. 4 mm fissural nodule in the right major fissure.
4 mm right pulmonary nodule, possibly an intrapulmonary lymph node.
No routine follow-up imaging is recommended per [HOSPITAL]
Guidelines.
These guidelines do not apply to immunocompromised patients and
patients with cancer. Follow up in patients with significant
comorbidities as clinically warranted. For lung cancer screening,
adhere to Lung-RADS guidelines. Reference: Radiology. 3160;

## 2023-11-25 DIAGNOSIS — R202 Paresthesia of skin: Secondary | ICD-10-CM | POA: Diagnosis not present

## 2024-01-27 DIAGNOSIS — D225 Melanocytic nevi of trunk: Secondary | ICD-10-CM | POA: Diagnosis not present

## 2024-01-27 DIAGNOSIS — Z85820 Personal history of malignant melanoma of skin: Secondary | ICD-10-CM | POA: Diagnosis not present

## 2024-01-27 DIAGNOSIS — L814 Other melanin hyperpigmentation: Secondary | ICD-10-CM | POA: Diagnosis not present

## 2024-01-27 DIAGNOSIS — L851 Acquired keratosis [keratoderma] palmaris et plantaris: Secondary | ICD-10-CM | POA: Diagnosis not present

## 2024-02-05 ENCOUNTER — Ambulatory Visit: Admitting: Orthopedic Surgery

## 2024-02-05 ENCOUNTER — Other Ambulatory Visit (INDEPENDENT_AMBULATORY_CARE_PROVIDER_SITE_OTHER)

## 2024-02-05 VITALS — BP 162/99 | HR 57 | Ht 61.0 in | Wt 142.6 lb

## 2024-02-05 DIAGNOSIS — M545 Low back pain, unspecified: Secondary | ICD-10-CM

## 2024-02-05 MED ORDER — PREGABALIN 75 MG PO CAPS: 75.0000 mg | ORAL_CAPSULE | Freq: Two times a day (BID) | ORAL | 1 refills | Status: DC

## 2024-02-05 NOTE — Progress Notes (Signed)
 Orthopedic Spine Surgery Office Note  Assessment: Patient is a 68 y.o. female with low back pain and bilateral lower extremity numbness/paresthesias distal to the knees.  Has a spondylolisthesis at L4/5   Plan: -Explained that initially conservative treatment is tried as a significant number of patients may experience relief with these treatment modalities. Discussed that the conservative treatments include:  -activity modification  -physical therapy  -over the counter pain medications  -medrol dosepak  -lumbar steroid injections -Patient has tried Tylenol, ibuprofen -Recommended PT. Referral provided to her today. Prescribed Lyrica for additional pain relief -If she is not doing any better at her next visit, we will order an MRI of the lumbar spine to evaluate further -Patient should return to office in 6-7 weeks, x-rays at next visit: None   Patient expressed understanding of the plan and all questions were answered to the patient's satisfaction.   ___________________________________________________________________________   History:  Patient is a 68 y.o. female who presents today for lumbar spine.  Patient has had several years of low back pain that has gotten progressively worse with time.  She notes the pain is worse with changing position.  She has also developed numbness and paresthesias distal to the knees bilaterally.  There was no trauma or injury that preceded the onset of the pain or the numbness/paresthesias.  She has tried Tylenol and ibuprofen and rest for her pain.  These modalities are no longer providing her with consistent relief.  Has had difficulty with laying flat on her back at night as that causes her more pain as well.   Weakness: Denies Symptoms of imbalance: Denies Paresthesias and numbness: Yes, gets numbness and paresthesias distal to knees bilaterally.  Also has numbness and paresthesias in her bilateral hands.  Both of these numbness and paresthesias do  come and go at times. Bowel or bladder incontinence: Denies Saddle anesthesia: Denies  Treatments tried: Tylenol, ibuprofen  Review of systems: Denies fevers and chills, night sweats, unexplained weight loss. Has a history of melanoma.  Has had pain that wakes her at night  Past medical history: Melanoma HTN  Allergies: NKDA  Past surgical history:  Appendectomy  Social history: Denies use of nicotine product (smoking, vaping, patches, smokeless) Alcohol use: denies Denies recreational drug use   Physical Exam:  BMI of 26.9  General: no acute distress, appears stated age Neurologic: alert, answering questions appropriately, following commands Respiratory: unlabored breathing on room air, symmetric chest rise Psychiatric: appropriate affect, normal cadence to speech   MSK (spine):  -Strength exam      Left  Right EHL    5/5  5/5 TA    5/5  5/5 GSC    5/5  5/5 Knee extension  5/5  5/5 Hip flexion   5/5  5/5  -Sensory exam    Sensation intact to light touch in L3-S1 nerve distributions of bilateral lower extremities  -Achilles DTR: 1/4 on the left, 1/4 on the right -Patellar tendon DTR: 1/4 on the left, 1/4 on the right  -Straight leg raise: negative bilaterally -Clonus: no beats bilaterally  -Left hip exam: no pain through range of motion, negative FADIR, negative FABER -Right hip exam: no pain through range of motion, negative FADIR, negative FABER  Imaging: XRs of the lumbar spine from 02/05/2024 were independently reviewed and interpreted, showing spondylolisthesis at L4/5.  Disc at loss at L4/5 and L5/S1.  No other significant degenerative changes seen.  No fracture or dislocation seen.   Patient name: Alison Robles  Patient MRN: [id number] Date of visit: 02/05/24

## 2024-02-10 ENCOUNTER — Encounter: Payer: Self-pay | Admitting: Radiology

## 2024-02-13 DIAGNOSIS — R2689 Other abnormalities of gait and mobility: Secondary | ICD-10-CM | POA: Diagnosis not present

## 2024-02-13 DIAGNOSIS — M2560 Stiffness of unspecified joint, not elsewhere classified: Secondary | ICD-10-CM | POA: Diagnosis not present

## 2024-02-13 DIAGNOSIS — M5489 Other dorsalgia: Secondary | ICD-10-CM | POA: Diagnosis not present

## 2024-02-17 DIAGNOSIS — R2689 Other abnormalities of gait and mobility: Secondary | ICD-10-CM | POA: Diagnosis not present

## 2024-02-17 DIAGNOSIS — M2560 Stiffness of unspecified joint, not elsewhere classified: Secondary | ICD-10-CM | POA: Diagnosis not present

## 2024-02-17 DIAGNOSIS — M5489 Other dorsalgia: Secondary | ICD-10-CM | POA: Diagnosis not present

## 2024-02-19 DIAGNOSIS — M2560 Stiffness of unspecified joint, not elsewhere classified: Secondary | ICD-10-CM | POA: Diagnosis not present

## 2024-02-19 DIAGNOSIS — R2689 Other abnormalities of gait and mobility: Secondary | ICD-10-CM | POA: Diagnosis not present

## 2024-02-19 DIAGNOSIS — M5489 Other dorsalgia: Secondary | ICD-10-CM | POA: Diagnosis not present

## 2024-02-20 DIAGNOSIS — Z1159 Encounter for screening for other viral diseases: Secondary | ICD-10-CM | POA: Diagnosis not present

## 2024-02-20 DIAGNOSIS — Z1331 Encounter for screening for depression: Secondary | ICD-10-CM | POA: Diagnosis not present

## 2024-02-20 DIAGNOSIS — H8110 Benign paroxysmal vertigo, unspecified ear: Secondary | ICD-10-CM | POA: Diagnosis not present

## 2024-02-20 DIAGNOSIS — I10 Essential (primary) hypertension: Secondary | ICD-10-CM | POA: Diagnosis not present

## 2024-02-20 DIAGNOSIS — M8580 Other specified disorders of bone density and structure, unspecified site: Secondary | ICD-10-CM | POA: Diagnosis not present

## 2024-02-20 DIAGNOSIS — Z79899 Other long term (current) drug therapy: Secondary | ICD-10-CM | POA: Diagnosis not present

## 2024-02-20 DIAGNOSIS — M51379 Other intervertebral disc degeneration, lumbosacral region without mention of lumbar back pain or lower extremity pain: Secondary | ICD-10-CM | POA: Diagnosis not present

## 2024-02-20 DIAGNOSIS — R931 Abnormal findings on diagnostic imaging of heart and coronary circulation: Secondary | ICD-10-CM | POA: Diagnosis not present

## 2024-02-20 DIAGNOSIS — Z9181 History of falling: Secondary | ICD-10-CM | POA: Diagnosis not present

## 2024-02-27 DIAGNOSIS — M2560 Stiffness of unspecified joint, not elsewhere classified: Secondary | ICD-10-CM | POA: Diagnosis not present

## 2024-02-27 DIAGNOSIS — M5489 Other dorsalgia: Secondary | ICD-10-CM | POA: Diagnosis not present

## 2024-02-27 DIAGNOSIS — R2689 Other abnormalities of gait and mobility: Secondary | ICD-10-CM | POA: Diagnosis not present

## 2024-03-03 DIAGNOSIS — R2689 Other abnormalities of gait and mobility: Secondary | ICD-10-CM | POA: Diagnosis not present

## 2024-03-03 DIAGNOSIS — M5489 Other dorsalgia: Secondary | ICD-10-CM | POA: Diagnosis not present

## 2024-03-03 DIAGNOSIS — M2560 Stiffness of unspecified joint, not elsewhere classified: Secondary | ICD-10-CM | POA: Diagnosis not present

## 2024-04-13 ENCOUNTER — Ambulatory Visit: Admitting: Orthopedic Surgery

## 2024-04-13 DIAGNOSIS — M5416 Radiculopathy, lumbar region: Secondary | ICD-10-CM

## 2024-04-13 MED ORDER — PREGABALIN 75 MG PO CAPS: 75.0000 mg | ORAL_CAPSULE | Freq: Two times a day (BID) | ORAL | 2 refills | Status: AC

## 2024-04-13 NOTE — Progress Notes (Signed)
 Orthopedic Spine Surgery Office Note   Assessment: Patient is a 69 y.o. female with low back pain and bilateral lower extremity numbness/paresthesias distal to the knees.  Has a spondylolisthesis at L4/5     Plan: -Patient has tried PT, Tylenol, ibuprofen, lyrica  - Since patient is doing better since starting Lyrica , will hold off on MRI.  Provided her with a new prescription of Lyrica  -Patient should return to office in 3 months, x-rays at next visit: AP/lateral/flex/ex lumbar     Patient expressed understanding of the plan and all questions were answered to the patient's satisfaction.    ___________________________________________________________________________     History:   Patient is a 69 y.o. female who presents today for follow-up on her lumbar spine.  Patient has been doing well since she was last in the office.  She found the Lyrica  helpful.  She had been having numbness and paresthesias distally bilaterally.  The Lyrica  was controlling this symptom well.  She ran out about a week ago and symptoms have gradually returned.  She also does have some discomfort in her back but that was more tolerable with the Lyrica  as well.  She is also noticed some hip stiffness particularly at night.  She does not feel the symptoms during the day.  She has not developed any other new symptoms since she was last in the office.   Treatments tried: PT, tylenol, ibuprofen, lyrica      Physical Exam:   General: no acute distress, appears stated age Neurologic: alert, answering questions appropriately, following commands Respiratory: unlabored breathing on room air, symmetric chest rise Psychiatric: appropriate affect, normal cadence to speech     MSK (spine):   -Strength exam                                                   Left                  Right EHL                              5/5                  5/5 TA                                 5/5                  5/5 GSC                              5/5                  5/5 Knee extension            5/5                  5/5 Hip flexion                    5/5                  5/5   -Sensory exam  Sensation intact to light touch in L3-S1 nerve distributions of bilateral lower extremities   -Left hip exam: no pain through range of motion, negative FADIR, negative FABER -Right hip exam: no pain through range of motion, negative FADIR, negative FABER   Imaging: XRs of the lumbar spine from 02/05/2024 were previously independently reviewed and interpreted, showing spondylolisthesis at L4/5.  Disc at loss at L4/5 and L5/S1.  No other significant degenerative changes seen.  No fracture or dislocation seen.     Patient name: Alison Robles Patient MRN: [id number] Date of visit: 04/13/2024

## 2024-07-13 ENCOUNTER — Ambulatory Visit: Admitting: Orthopedic Surgery
